# Patient Record
Sex: Male | Born: 2000 | Race: White | Hispanic: No | Marital: Single | Smoking: Never smoker
Health system: Southern US, Community
[De-identification: ages and names within clinical notes are randomized; demographics above are authoritative.]

## PROBLEM LIST (undated history)

## (undated) DIAGNOSIS — Q249 Congenital malformation of heart, unspecified: Secondary | ICD-10-CM

## (undated) DIAGNOSIS — Q211 Atrial septal defect, unspecified: Secondary | ICD-10-CM

## (undated) DIAGNOSIS — I10 Essential (primary) hypertension: Secondary | ICD-10-CM

## (undated) DIAGNOSIS — Q220 Pulmonary valve atresia: Secondary | ICD-10-CM

## (undated) HISTORY — DX: Pulmonary valve atresia: Q22.0

## (undated) HISTORY — DX: Atrial septal defect, unspecified: Q21.10

## (undated) HISTORY — PX: CARDIAC SURGERY: SHX584

---
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
## 2022-11-18 ENCOUNTER — Ambulatory Visit: Payer: BLUE CROSS/BLUE SHIELD | Admitting: Emergency Medicine

## 2023-04-04 ENCOUNTER — Emergency Department (HOSPITAL_COMMUNITY)
Admission: EM | Admit: 2023-04-04 | Discharge: 2023-04-04 | Disposition: A | Discharge status: Home / Self Care | Payer: BC Managed Care – PPO | Attending: Emergency Medicine | Admitting: Emergency Medicine

## 2023-04-04 ENCOUNTER — Other Ambulatory Visit: Payer: Self-pay

## 2023-04-04 ENCOUNTER — Emergency Department (HOSPITAL_COMMUNITY): Payer: BC Managed Care – PPO

## 2023-04-04 ENCOUNTER — Encounter (HOSPITAL_COMMUNITY): Payer: Self-pay | Admitting: *Deleted

## 2023-04-04 DIAGNOSIS — R079 Chest pain, unspecified: Secondary | ICD-10-CM | POA: Insufficient documentation

## 2023-04-04 HISTORY — DX: Essential (primary) hypertension: I10

## 2023-04-04 LAB — CBC
HCT: 46.3 % (ref 39.0–52.0)
Hemoglobin: 15.8 g/dL (ref 13.0–17.0)
MCH: 31.5 pg (ref 26.0–34.0)
MCHC: 34.1 g/dL (ref 30.0–36.0)
MCV: 92.2 fL (ref 80.0–100.0)
Platelets: 251 10*3/uL (ref 150–400)
RBC: 5.02 MIL/uL (ref 4.22–5.81)
RDW: 11.6 % (ref 11.5–15.5)
WBC: 10.9 10*3/uL — ABNORMAL HIGH (ref 4.0–10.5)
nRBC: 0 % (ref 0.0–0.2)

## 2023-04-04 LAB — BASIC METABOLIC PANEL
Anion gap: 9 (ref 5–15)
BUN: 16 mg/dL (ref 6–20)
CO2: 22 mmol/L (ref 22–32)
Calcium: 10 mg/dL (ref 8.9–10.3)
Chloride: 107 mmol/L (ref 98–111)
Creatinine, Ser: 1.02 mg/dL (ref 0.61–1.24)
GFR, Estimated: >60 mL/min (ref >60)
Glucose, Bld: 109 mg/dL — ABNORMAL HIGH (ref 70–99)
Potassium: 3.6 mmol/L (ref 3.5–5.1)
Sodium: 138 mmol/L (ref 135–145)

## 2023-04-04 LAB — TROPONIN I (HIGH SENSITIVITY): Troponin I (High Sensitivity): 2 ng/L (ref <18)

## 2023-04-04 NOTE — ED Notes (Signed)
Pt wants to leave AMA, RN informed providers.

## 2023-04-04 NOTE — ED Provider Notes (Signed)
[place] EMERGENCY DEPARTMENT AT [facility] Provider Note   CSN: [id number] Arrival date & time: 04/04/23  2032     History  Chief Complaint  Patient presents with   Chest Pain    Jimmy Hahn is a 22 y.o. male.  Patient presents to the emergency department for evaluation of chest pain.  Patient reports that symptoms began around 7:30 PM today.  He got very stressed over multiple social issues that he is dealing with and felt some pressure in the left side of his chest.  He reports that that resolved at the time he got to the ED and has not recurred.  No shortness of breath.       Home Medications Prior to Admission medications   Medication Sig Start Date End Date Taking? Authorizing Provider  losartan (COZAAR) 25 MG tablet Take 25 mg by mouth daily.   Yes [provider]  ondansetron (ZOFRAN) 4 MG tablet Take 1 tablet (4 mg total) by mouth every 6 (six) hours. Prn n/v. Patient not taking: Reported on 04/04/2023 10/16/15   Linna Hoff, MD  oseltamivir (TAMIFLU) 75 MG capsule Take 1 capsule (75 mg total) by mouth every 12 (twelve) hours. Take all of medication. Patient not taking: Reported on 04/04/2023 10/16/15   Linna Hoff, MD      Allergies    Penicillins    Review of Systems   Review of Systems  Physical Exam Updated Vital Signs BP (!) 171/117   Pulse 90   Temp 98.7 F (37.1 C) (Oral)   Resp 18   Ht 5\' 11"  (1.803 m)   Wt 104.3 kg   SpO2 97%   BMI 32.08 kg/m  Physical Exam Vitals and nursing note reviewed.  Constitutional:      General: He is not in acute distress.    Appearance: He is well-developed.  HENT:     Head: Normocephalic and atraumatic.     Mouth/Throat:     Mouth: Mucous membranes are moist.  Eyes:     General: Vision grossly intact. Gaze aligned appropriately.     Extraocular Movements: Extraocular movements intact.     Conjunctiva/sclera: Conjunctivae normal.  Cardiovascular:     Rate and Rhythm: Normal rate  and regular rhythm.     Pulses: Normal pulses.     Heart sounds: Normal heart sounds, S1 normal and S2 normal. No murmur heard.    No friction rub. No gallop.  Pulmonary:     Effort: Pulmonary effort is normal. No respiratory distress.     Breath sounds: Normal breath sounds.  Abdominal:     Palpations: Abdomen is soft.     Tenderness: There is no abdominal tenderness. There is no guarding or rebound.     Hernia: No hernia is present.  Musculoskeletal:        General: No swelling.     Cervical back: Full passive range of motion without pain, normal range of motion and neck supple. No pain with movement, spinous process tenderness or muscular tenderness. Normal range of motion.     Right lower leg: No edema.     Left lower leg: No edema.  Skin:    General: Skin is warm and dry.     Capillary Refill: Capillary refill takes less than 2 seconds.     Findings: No ecchymosis, erythema, lesion or wound.  Neurological:     Mental Status: He is alert and oriented to person, place, and time.  GCS: GCS eye subscore is 4. GCS verbal subscore is 5. GCS motor subscore is 6.     Cranial Nerves: Cranial nerves 2-12 are intact.     Sensory: Sensation is intact.     Motor: Motor function is intact. No weakness or abnormal muscle tone.     Coordination: Coordination is intact.  Psychiatric:        Mood and Affect: Mood normal.        Speech: Speech normal.        Behavior: Behavior normal.     ED Results / Procedures / Treatments   Labs (all labs ordered are listed, but only abnormal results are displayed) Labs Reviewed  BASIC METABOLIC PANEL - Abnormal; Notable for the following components:      Result Value   Glucose, Bld 109 (*)    All other components within normal limits  CBC - Abnormal; Notable for the following components:   WBC 10.9 (*)    All other components within normal limits  TROPONIN I (HIGH SENSITIVITY)    EKG EKG Interpretation Date/Time:  Sunday April 04 2023  20:41:22 EDT Ventricular Rate:  130 PR Interval:  133 QRS Duration:  84 QT Interval:  345 QTC Calculation: 508 R Axis:   86  Text Interpretation: Sinus tachycardia Abnormal T, consider ischemia, diffuse leads Prolonged QT interval Confirmed by Gilda Crease [phone]) on 04/04/2023 11:15:05 PM  Radiology DG Chest 2 View  Result Date: 04/04/2023 CLINICAL DATA:  Acute onset chest pain.  Tachycardia. EXAM: CHEST - 2 VIEW COMPARISON:  Chest radiograph dated 07/15/2006 FINDINGS: Normal lung volumes. No focal consolidations. No pleural effusion or pneumothorax. The heart size and mediastinal contours are within normal limits. Septal occlusion device. Median sternotomy wires are nondisplaced. IMPRESSION: No active cardiopulmonary disease. Electronically Signed   By: Agustin Cree M.D.   On: 04/04/2023 21:26    Procedures Procedures    Medications Ordered in ED Medications - No data to display  ED Course/ Medical Decision Making/ A&P                                 Medical Decision Making Amount and/or Complexity of Data Reviewed External Data Reviewed: ECG and notes. Labs: ordered. Decision-making details documented in ED Course. Radiology: ordered and independent interpretation performed. Decision-making details documented in ED Course. ECG/medicine tests: ordered and independent interpretation performed. Decision-making details documented in ED Course.   Presents with complaints of chest pain.  Patient reports onset of symptoms when he was stressed at home earlier tonight, symptoms spontaneously resolved.  He has not had any chest pain or further symptoms here in the ED.  Patient's EKG shows sinus tachycardia at arrival.  While here in the ED his tachycardia has resolved.  Patient had been in the room for some time at the time that I started my shift.  I went to examine the patient and he informing that he could not stay any longer.  Patient reports that he got scared because of his  heart history (pulmonary atresia, status post Fontan) but he thinks he was just stressed.  I discussed with the patient that he needs a second troponin and there are other things to worry about with his sinus tachycardia, including PE.  Patient indicates that he has to work at 6 AM and cannot stay any longer.  His workup includes a single troponin that was negative.  He understands that  I would prefer to complete the workup but he will not stay for a second troponin.  Will discharge patient, given return precautions.        Final Clinical Impression(s) / ED Diagnoses Final diagnoses:  Nonspecific chest pain    Rx / DC Orders ED Discharge Orders     None         Gilda Crease, MD 04/04/23 2318

## 2023-04-04 NOTE — ED Notes (Signed)
Provider bedside.

## 2023-04-04 NOTE — ED Notes (Signed)
RN assumed care of pt, found him alert and oriented - pain free.  Pt reports family drama that lead him to be "kicked out of my house....so I think it is anxiety related but with my hole in my heart I just wanted to be checked out."  Pt has extensive cardiac hx.    Pt states he now feels better, is pain free.

## 2023-04-04 NOTE — ED Triage Notes (Addendum)
Pt reporting onset of chest pain after an argument today (started about 30 minutes ago). Hx of cardiac surgery- valve repair. HR 125-134 sinus in triage. Denies drug or etoh use.

## 2024-03-07 ENCOUNTER — Ambulatory Visit: Admitting: Family Medicine

## 2024-03-07 DIAGNOSIS — Z0000 Encounter for general adult medical examination without abnormal findings: Secondary | ICD-10-CM

## 2024-04-20 ENCOUNTER — Ambulatory Visit: Admitting: Family Medicine

## 2024-04-20 VITALS — BP 131/63 | HR 82 | Temp 98.0°F | Resp 16 | Ht 69.25 in | Wt 236.2 lb

## 2024-04-20 DIAGNOSIS — Z23 Encounter for immunization: Secondary | ICD-10-CM

## 2024-04-20 DIAGNOSIS — Z953 Presence of xenogenic heart valve: Secondary | ICD-10-CM

## 2024-04-20 DIAGNOSIS — Q211 Atrial septal defect, unspecified: Secondary | ICD-10-CM | POA: Insufficient documentation

## 2024-04-20 DIAGNOSIS — Z0000 Encounter for general adult medical examination without abnormal findings: Secondary | ICD-10-CM | POA: Diagnosis not present

## 2024-04-20 DIAGNOSIS — Q231 Congenital insufficiency of aortic valve: Secondary | ICD-10-CM | POA: Insufficient documentation

## 2024-04-20 DIAGNOSIS — R5383 Other fatigue: Secondary | ICD-10-CM | POA: Diagnosis not present

## 2024-04-20 DIAGNOSIS — F419 Anxiety disorder, unspecified: Secondary | ICD-10-CM | POA: Insufficient documentation

## 2024-04-20 DIAGNOSIS — Z13220 Encounter for screening for lipoid disorders: Secondary | ICD-10-CM

## 2024-04-20 DIAGNOSIS — Q255 Atresia of pulmonary artery: Secondary | ICD-10-CM

## 2024-04-20 DIAGNOSIS — Q249 Congenital malformation of heart, unspecified: Secondary | ICD-10-CM | POA: Diagnosis not present

## 2024-04-20 DIAGNOSIS — Z7289 Other problems related to lifestyle: Secondary | ICD-10-CM

## 2024-04-20 MED ORDER — LOSARTAN POTASSIUM 25 MG PO TABS: 25.0000 mg | ORAL_TABLET | Freq: Every day | ORAL | 0 refills | Status: AC

## 2024-04-20 MED ORDER — BUSPIRONE HCL 5 MG PO TABS: 5.0000 mg | ORAL_TABLET | Freq: Two times a day (BID) | ORAL | 0 refills | Status: AC

## 2024-04-20 NOTE — Assessment & Plan Note (Signed)
-   Update echocardiogram. - Refer to [facility] for cardiology follow-up. - Restart losartan at 25 mg, start with half a tablet, monitor for hypotension. - Recheck kidney function in 2-4 weeks.

## 2024-04-20 NOTE — Patient Instructions (Signed)
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
## 2024-04-26 ENCOUNTER — Ambulatory Visit (HOSPITAL_BASED_OUTPATIENT_CLINIC_OR_DEPARTMENT_OTHER)

## 2024-05-04 ENCOUNTER — Other Ambulatory Visit
